# Patient Record
Sex: Female | Born: 2000 | Race: White | Hispanic: No | Marital: Single | Smoking: Never smoker
Health system: Southern US, Community
[De-identification: ages and names within clinical notes are randomized; demographics above are authoritative.]

## PROBLEM LIST (undated history)

## (undated) DIAGNOSIS — Z789 Other specified health status: Secondary | ICD-10-CM

## (undated) DIAGNOSIS — J45909 Unspecified asthma, uncomplicated: Secondary | ICD-10-CM

## (undated) DIAGNOSIS — B2790 Infectious mononucleosis, unspecified without complication: Secondary | ICD-10-CM

## (undated) HISTORY — PX: WISDOM TOOTH EXTRACTION: SHX21

## (undated) HISTORY — DX: Infectious mononucleosis, unspecified without complication: B27.90

## (undated) HISTORY — DX: Unspecified asthma, uncomplicated: J45.909

---
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
## 2018-12-18 IMAGING — MR MR HEAD W/O CM
10 series · 48 of 48 positions shown · non-contrast
Comparison: None.

CLINICAL DATA: New onset headaches. Loss of consciousness with
changing position.

EXAM:
MRI HEAD WITHOUT CONTRAST
TECHNIQUE: Multiplanar, multiecho pulse sequences of the brain and surrounding
structures were obtained without intravenous contrast.

[Series 2: t1_se_sag · sagittal · 5.0mm · 0.45mm/px · 3 of 21 slices shown]
[im 1/21]
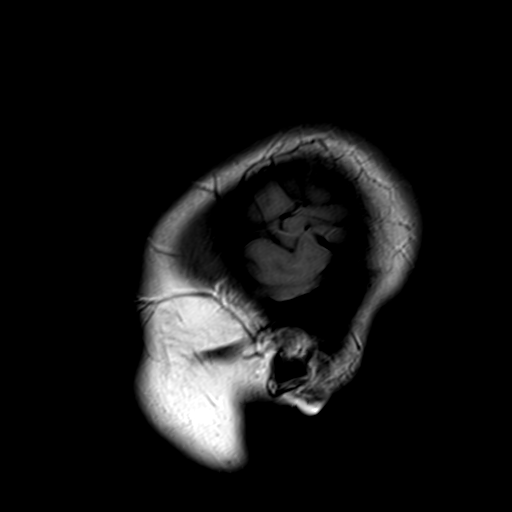
[im 11/21]
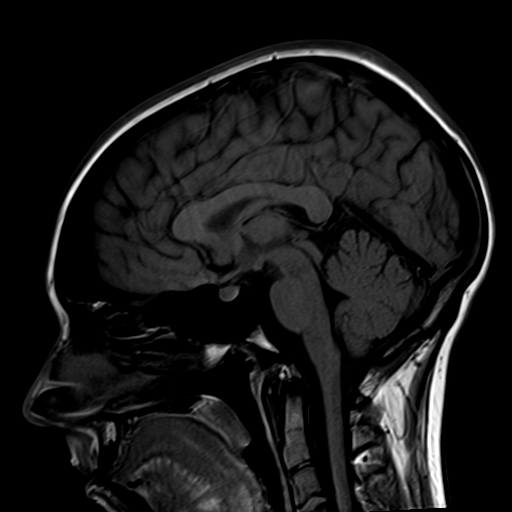
[im 21/21]
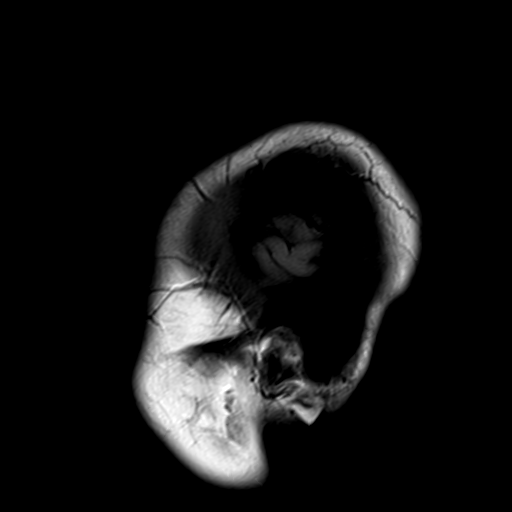

[Series 3: ep2d_diff_(id)_trace · axial · 3.0mm · 1.80mm/px · z∈[-27,+114]mm · 9 of 95 slices shown]
[im 1/95]
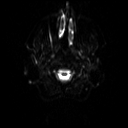
[im 12/95]
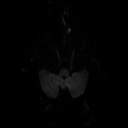
[im 24/95]
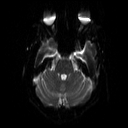
[im 36/95]
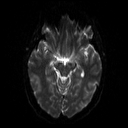
[im 48/95]
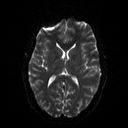
[im 59/95]
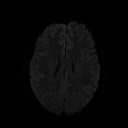
[im 71/95]
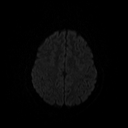
[im 83/95]
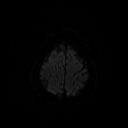
[im 95/95]
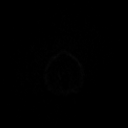

[Series 4: ep2d_diff_(id)_trace_adc · axial · 3.0mm · 1.80mm/px · z∈[-27,+114]mm · 4 of 48 slices shown]
[im 1/48]
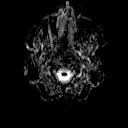
[im 16/48]
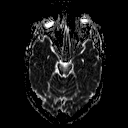
[im 32/48]
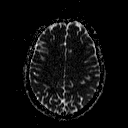
[im 48/48]
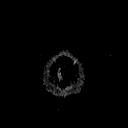

[Series 5: ep2d_diff_cor · coronal · 5.0mm · 1.77mm/px · 4 of 48 slices shown]
[im 1/48]
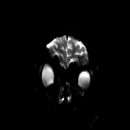
[im 16/48]
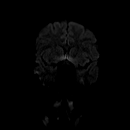
[im 32/48]
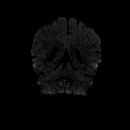
[im 48/48]
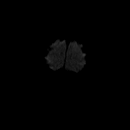

[Series 6: ep2d_diff_cor_adc · coronal · 5.0mm · 1.77mm/px · 2 of 25 slices shown]
[im 1/25]
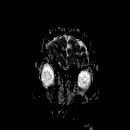
[im 25/25]
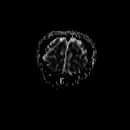

[Series 8: swi_images · axial · 2.0mm · 0.90mm/px · z∈[-36,+121]mm · 7 of 80 slices shown]
[im 1/80]
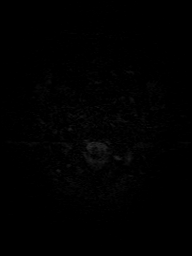
[im 14/80]
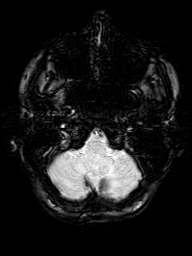
[im 27/80]
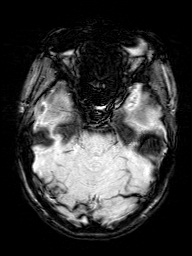
[im 40/80]
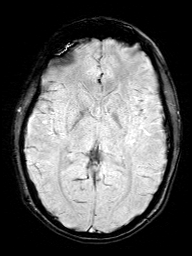
[im 53/80]
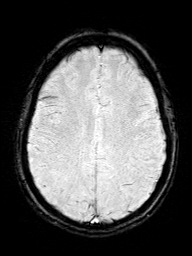
[im 66/80]
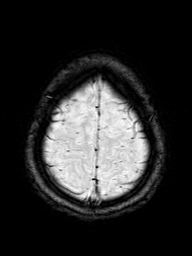
[im 80/80]
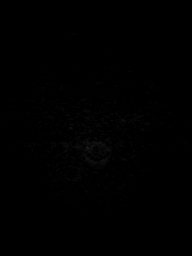

[Series 9: FLAIR · axial · 3.0mm · 0.43mm/px · z∈[-28,+115]mm · 2 of 25 slices shown]
[im 1/25]
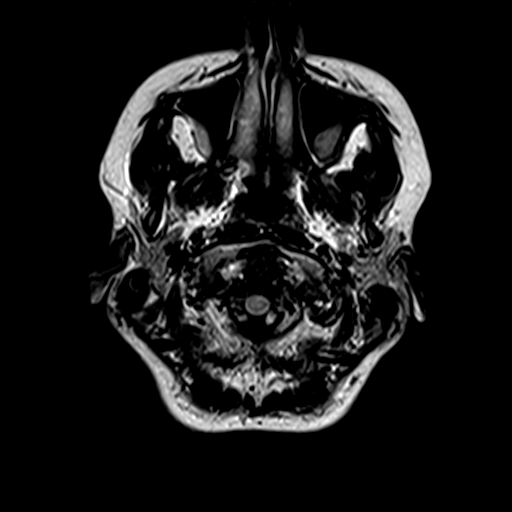
[im 25/25]
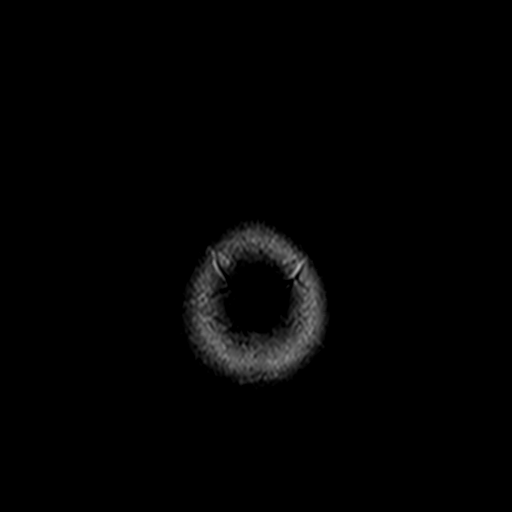

[Series 10: t2_tse_tra_512 · axial · 5.0mm · 0.60mm/px · z∈[-26,+111]mm · 2 of 24 slices shown]
[im 1/24]
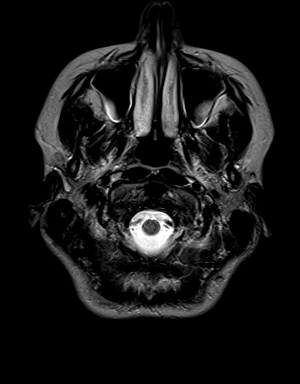
[im 24/24]
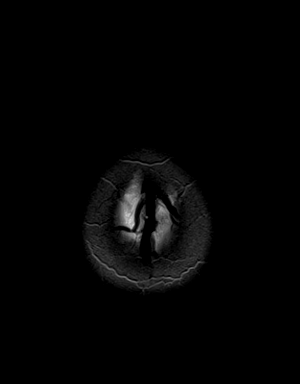

[Series 11: t1_mpr_tra · axial · 1.0mm · 0.72mm/px · z∈[-29,+113]mm · 13 of 144 slices shown]
[im 1/144]
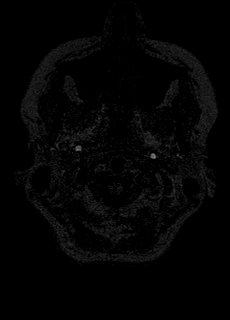
[im 12/144]
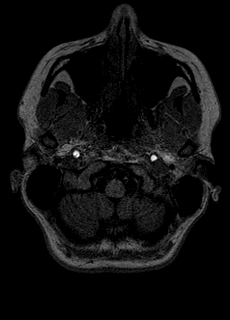
[im 24/144]
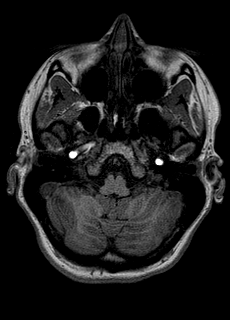
[im 36/144]
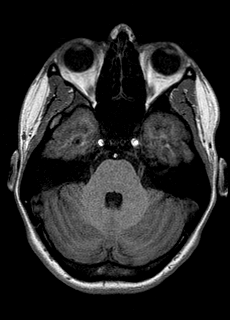
[im 48/144]
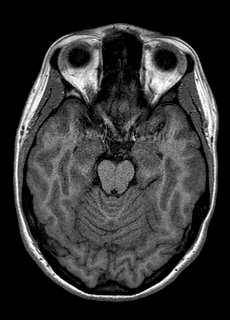
[im 60/144]
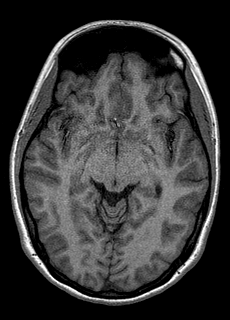
[im 72/144]
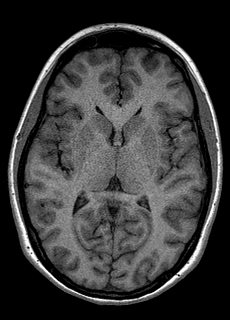
[im 84/144]
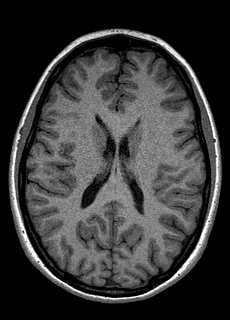
[im 96/144]
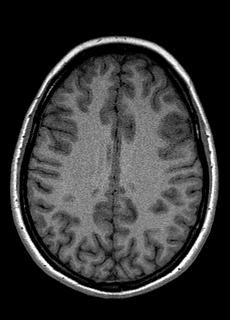
[im 108/144]
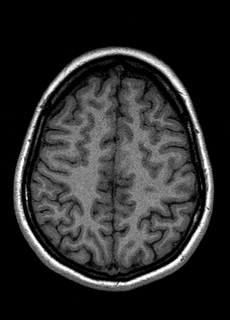
[im 120/144]
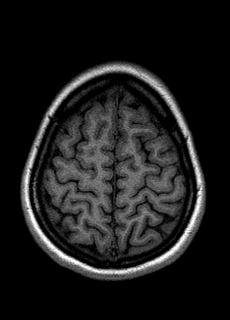
[im 132/144]
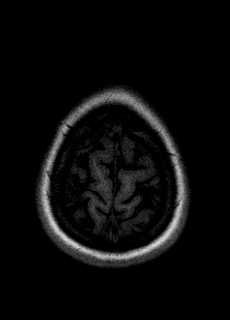
[im 144/144]
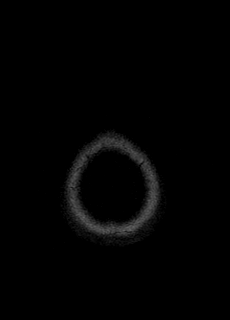

[Series 12: T2 · coronal · 5.0mm · 0.45mm/px · 2 of 26 slices shown]
[im 1/26]
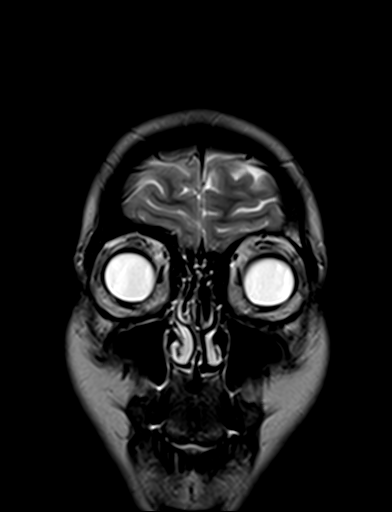
[im 26/26]
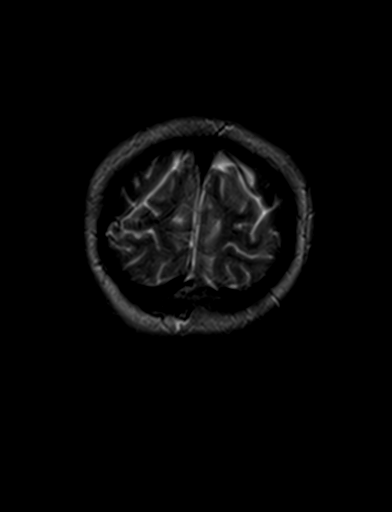

[48 of 48 positions shown; findings below may reference images not displayed]

FINDINGS: Brain: Normal volume and morphology of brain. No acute or remote
infarction, hemorrhage, hydrocephalus, extra-axial collection or
mass lesion. No white matter disease.

Vascular: Major flow voids are preserved.

Skull and upper cervical spine: No evidence of marrow lesion.
Partial non segmentation at C2-3.

Sinuses/Orbits: Very mild mucosal thickening along the floors of the
maxillary sinuses. Negative orbits.
IMPRESSION: 1. Normal appearance of the brain.
2. Very mild mucosal thickening in the maxillary sinuses.

## 2022-06-06 DIAGNOSIS — H65192 Other acute nonsuppurative otitis media, left ear: Secondary | ICD-10-CM | POA: Diagnosis not present

## 2022-06-06 DIAGNOSIS — R070 Pain in throat: Secondary | ICD-10-CM | POA: Diagnosis not present

## 2022-06-06 DIAGNOSIS — R509 Fever, unspecified: Secondary | ICD-10-CM | POA: Diagnosis not present

## 2022-06-06 DIAGNOSIS — H9202 Otalgia, left ear: Secondary | ICD-10-CM | POA: Diagnosis not present

## 2022-06-06 DIAGNOSIS — J020 Streptococcal pharyngitis: Secondary | ICD-10-CM | POA: Diagnosis not present

## 2023-08-11 ENCOUNTER — Emergency Department (HOSPITAL_BASED_OUTPATIENT_CLINIC_OR_DEPARTMENT_OTHER)
Admission: EM | Admit: 2023-08-11 | Discharge: 2023-08-11 | Disposition: A | Discharge status: Home / Self Care | Source: Ambulatory Visit | Attending: Emergency Medicine | Admitting: Emergency Medicine

## 2023-08-11 ENCOUNTER — Encounter (HOSPITAL_BASED_OUTPATIENT_CLINIC_OR_DEPARTMENT_OTHER): Payer: Self-pay

## 2023-08-11 ENCOUNTER — Other Ambulatory Visit (HOSPITAL_BASED_OUTPATIENT_CLINIC_OR_DEPARTMENT_OTHER): Payer: Self-pay

## 2023-08-11 ENCOUNTER — Other Ambulatory Visit: Payer: Self-pay

## 2023-08-11 DIAGNOSIS — R109 Unspecified abdominal pain: Secondary | ICD-10-CM | POA: Diagnosis not present

## 2023-08-11 DIAGNOSIS — R112 Nausea with vomiting, unspecified: Secondary | ICD-10-CM | POA: Diagnosis present

## 2023-08-11 DIAGNOSIS — D72829 Elevated white blood cell count, unspecified: Secondary | ICD-10-CM | POA: Insufficient documentation

## 2023-08-11 LAB — COMPREHENSIVE METABOLIC PANEL WITH GFR
ALT: 43 U/L (ref 0–44)
AST: 38 U/L (ref 15–41)
Albumin: 4.9 g/dL (ref 3.5–5.0)
Alkaline Phosphatase: 72 U/L (ref 38–126)
Anion gap: 15 (ref 5–15)
BUN: 19 mg/dL (ref 6–20)
CO2: 24 mmol/L (ref 22–32)
Calcium: 10.1 mg/dL (ref 8.9–10.3)
Chloride: 103 mmol/L (ref 98–111)
Creatinine, Ser: 0.67 mg/dL (ref 0.44–1.00)
GFR, Estimated: >60 mL/min (ref >60)
Glucose, Bld: 78 mg/dL (ref 70–99)
Potassium: 4.5 mmol/L (ref 3.5–5.1)
Sodium: 142 mmol/L (ref 135–145)
Total Bilirubin: 0.6 mg/dL (ref 0.0–1.2)
Total Protein: 7.9 g/dL (ref 6.5–8.1)

## 2023-08-11 LAB — CBC WITH DIFFERENTIAL/PLATELET
Abs Immature Granulocytes: 0.08 K/uL — ABNORMAL HIGH (ref 0.00–0.07)
Basophils Absolute: 0 K/uL (ref 0.0–0.1)
Basophils Relative: 0 %
Eosinophils Absolute: 0 K/uL (ref 0.0–0.5)
Eosinophils Relative: 0 %
HCT: 40.6 % (ref 36.0–46.0)
Hemoglobin: 13.6 g/dL (ref 12.0–15.0)
Immature Granulocytes: 1 %
Lymphocytes Relative: 16 %
Lymphs Abs: 2.3 K/uL (ref 0.7–4.0)
MCH: 30.1 pg (ref 26.0–34.0)
MCHC: 33.5 g/dL (ref 30.0–36.0)
MCV: 89.8 fL (ref 80.0–100.0)
Monocytes Absolute: 0.3 K/uL (ref 0.1–1.0)
Monocytes Relative: 2 %
Neutro Abs: 11.5 K/uL — ABNORMAL HIGH (ref 1.7–7.7)
Neutrophils Relative %: 81 %
Platelets: 317 K/uL (ref 150–400)
RBC: 4.52 MIL/uL (ref 3.87–5.11)
RDW: 12.2 % (ref 11.5–15.5)
WBC: 14.3 K/uL — ABNORMAL HIGH (ref 4.0–10.5)
nRBC: 0 % (ref 0.0–0.2)

## 2023-08-11 LAB — URINALYSIS, ROUTINE W REFLEX MICROSCOPIC
Bacteria, UA: NONE SEEN
Bilirubin Urine: NEGATIVE
Glucose, UA: NEGATIVE mg/dL
Hgb urine dipstick: NEGATIVE
Ketones, ur: >80 mg/dL — AB
Leukocytes,Ua: NEGATIVE
Nitrite: NEGATIVE
Protein, ur: 30 mg/dL — AB
Specific Gravity, Urine: 1.03 (ref 1.005–1.030)
pH: 5.5 (ref 5.0–8.0)

## 2023-08-11 LAB — LIPASE, BLOOD: Lipase: 19 U/L (ref 11–51)

## 2023-08-11 LAB — PREGNANCY, URINE: Preg Test, Ur: NEGATIVE

## 2023-08-11 MED ORDER — SODIUM CHLORIDE 0.9 % IV BOLUS
1000.0000 mL | Freq: Once | INTRAVENOUS | Status: AC
  Administered 2023-08-11: 1000 mL via INTRAVENOUS

## 2023-08-11 MED ORDER — ONDANSETRON HCL 4 MG/2ML IJ SOLN
4.0000 mg | Freq: Once | INTRAMUSCULAR | Status: AC
  Administered 2023-08-11: 4 mg via INTRAVENOUS
  Filled 2023-08-11: qty 2

## 2023-08-11 MED ORDER — ONDANSETRON 4 MG PO TBDP
4.0000 mg | ORAL_TABLET | Freq: Three times a day (TID) | ORAL | 0 refills | Status: AC | PRN
  Filled 2023-08-11: qty 20, 7d supply, fill #0

## 2023-08-11 NOTE — ED Notes (Signed)
 DC paperwork given and verbally understood.

## 2023-08-11 NOTE — ED Triage Notes (Signed)
 Patient arrives POV with complaints of abdominal pan and several episodes of vomiting that started this morning. Rates discomfort a 9/10.

## 2023-08-11 NOTE — ED Provider Notes (Signed)
 [place] EMERGENCY DEPARTMENT AT [facility] Provider Note   CSN: [id number] Arrival date & time: 08/11/23  1202     Patient presents with: Emesis   Tracy White is a 23 y.o. female.   Patient with no pertinent past medical history presents today with complaints of nausea and vomiting.  She reports that her symptoms began this morning with several episodes of nonbloody emesis.  She reports she has continued to be nauseous but has not vomited since 11 am this morning. Endorses some mild abdominal soreness which she attributes to vomiting, however no significant pain. Denies diarrhea, reports she is having regular bowel movements and passing normal flatus. No history of similar symptoms previously. No history of abdominal surgeries. LMP was mid July and was normal for her. Denies any urinary symptoms. No fevers or chills.   The history is provided by the patient. No language interpreter was used.  Emesis      Prior to Admission medications   Medication Sig Start Date End Date Taking? Authorizing Provider  acetaminophen (TYLENOL) 325 MG tablet Take 325-650 mg by mouth 3 (three) times daily as needed (for a lesser intense headache but no more than 3 days a week.).    [provider]  Norethin Ace-Eth Estrad-FE (JUNEL FE 24 PO) Take 1 tablet by mouth daily.    [provider]  promethazine (PHENERGAN) 25 MG tablet Take 25-50 mg by mouth every 4 (four) hours as needed for nausea (associated with a migraine headache.).    [provider]  SUMAtriptan (IMITREX) 50 MG tablet Take 50 mg by mouth every 2 (two) hours as needed for migraine. May repeat in 2 hours if headache persists or recurs. No more than 2 per 24 hours or 2-3 days per week.    [provider]  topiramate (TOPAMAX) 25 MG capsule Take 25 mg by mouth daily. May increase by 25 mg every 1-2 weeks as needed and as tolerated up to 100 mg by mouth daily.    [provider]     Allergies: Azithromycin    Review of Systems  Gastrointestinal:  Positive for nausea and vomiting (resolved).  All other systems reviewed and are negative.   Updated Vital Signs BP 122/74 (BP Location: Right Arm)   Pulse (!) 56   Temp 97.9 F (36.6 C) (Oral)   Resp 20   Ht 5' 10 (1.778 m)   Wt 65.8 kg   SpO2 100%   BMI 20.81 kg/m   Physical Exam Vitals and nursing note reviewed.  Constitutional:      General: She is not in acute distress.    Appearance: Normal appearance. She is normal weight. She is not ill-appearing, toxic-appearing or diaphoretic.  HENT:     Head: Normocephalic and atraumatic.  Cardiovascular:     Rate and Rhythm: Normal rate.  Pulmonary:     Effort: Pulmonary effort is normal. No respiratory distress.  Abdominal:     General: Abdomen is flat.     Palpations: Abdomen is soft.     Tenderness: There is no abdominal tenderness. There is no guarding or rebound.  Musculoskeletal:        General: Normal range of motion.     Cervical back: Normal range of motion.  Skin:    General: Skin is warm and dry.  Neurological:     General: No focal deficit present.     Mental Status: She is alert.  Psychiatric:  Mood and Affect: Mood normal.        Behavior: Behavior normal.     (all labs ordered are listed, but only abnormal results are displayed) Labs Reviewed  CBC WITH DIFFERENTIAL/PLATELET - Abnormal; Notable for the following components:      Result Value   WBC 14.3 (*)    Neutro Abs 11.5 (*)    Abs Immature Granulocytes 0.08 (*)    All other components within normal limits  URINALYSIS, ROUTINE W REFLEX MICROSCOPIC - Abnormal; Notable for the following components:   Ketones, ur >80 (*)    Protein, ur 30 (*)    All other components within normal limits  PREGNANCY, URINE  COMPREHENSIVE METABOLIC PANEL WITH GFR  LIPASE, BLOOD    EKG: None  Radiology: No results found.   Procedures   Medications Ordered in the ED  sodium  chloride 0.9 % bolus 1,000 mL (has no administration in time range)  ondansetron  (ZOFRAN ) injection 4 mg (has no administration in time range)                                    Medical Decision Making Amount and/or Complexity of Data Reviewed Labs: ordered.  Risk Prescription drug management.   This patient is a 23 y.o. female who presents to the ED for concern of nausea and vomiting, this involves an extensive number of treatment options, and is a complaint that carries with it a high risk of complications and morbidity. The emergent differential diagnosis prior to evaluation includes, but is not limited to, DKA, Sepsis, drug-related, Appendicitis, Bowel obstruction, Electrolyte abnormalities, Pancreatitis, Biliary colic, Gastroenteritis, Gastroparesis, Hepatitis, Migraine, Thyroid disease, Renal colic, PUD, UTI, Pregnancy, Hyperemesis gravidarum    This is not an exhaustive differential.   Past Medical History / Co-morbidities / Social History:  has a past medical history of Asthma, Medical history non-contributory, and Mononucleosis.  Additional history: Chart reviewed.  Physical Exam: Physical exam performed. The pertinent findings include: well appearing, no vomiting on exam, abdomen soft and nontender  Lab Tests: I ordered, and personally interpreted labs.  The pertinent results include:  WBC 14.3, UA with ketones and protein   Medications: I ordered medication including zofran , fluids  for nausea, dehydration. Reevaluation of the patient after these medicines showed that the patient improved. I have reviewed the patients home medicines and have made adjustments as needed.   Disposition: After consideration of the diagnostic results and the patients response to treatment, I feel that emergency department workup does not suggest an emergent condition requiring admission or immediate intervention beyond what has been performed at this time. The plan is: discharge with Zofran   for any residual nausea/vomiting, close outpatient follow-up and return precautions.  After above interventions patient is feeling substantially improved and ready to go home.  She has not had any more vomiting since arriving today, is able to eat and drink without any symptoms.  Her workup is overall benign, she did have mild leukocytosis which could be consistent with gastroenteritis.  Discussed same with patient is understanding and agreement.  Given that her abdomen is soft and nontender and she is not having any significant pain, no further evaluation with CT imaging is indicated at this time. Evaluation and diagnostic testing in the emergency department does not suggest an emergent condition requiring admission or immediate intervention beyond what has been performed at this time.  Plan for discharge with close PCP  follow-up.  Patient is understanding and amenable with plan, educated on red flag symptoms that would prompt immediate return.  Patient discharged in stable condition.  Final diagnoses:  Nausea and vomiting, unspecified vomiting type    ED Discharge Orders          Ordered    ondansetron  (ZOFRAN -ODT) 4 MG disintegrating tablet  Every 8 hours PRN        08/11/23 1348          An After Visit Summary was printed and given to the patient.      Nora Lauraine DELENA DEVONNA 08/11/23 1349    Emil Share, DO 08/11/23 1401

## 2023-08-11 NOTE — Discharge Instructions (Signed)
 As we discussed, your workup in the ER today was overall reassuring for acute findings.  It does look like you are mildly dehydrated, we did give you IV fluids for management of this.  I recommend that you continue to hydrate at home specifically with fluids high in electrolytes such as Gatorade or Pedialyte.  I have given you a prescription for Zofran  which is a medication you can take for any residual nausea/vomiting.  Follow-up with your PCP as needed.  Return if development of any new or worsening symptoms.

## 2023-08-11 NOTE — ED Triage Notes (Incomplete)
 Pt caox4,
